# Patient Record
Sex: Female | Born: 1950 | Race: White | Hispanic: No | Marital: Married | Smoking: Never smoker
Health system: Southern US, Community
[De-identification: ages and names within clinical notes are randomized; demographics above are authoritative.]

## PROBLEM LIST (undated history)

## (undated) DIAGNOSIS — F32A Depression, unspecified: Secondary | ICD-10-CM

## (undated) HISTORY — PX: ABDOMINAL HYSTERECTOMY: SHX81

## (undated) HISTORY — PX: SHOULDER SURGERY: SHX246

## (undated) HISTORY — DX: Depression, unspecified: F32.A

---

## 2020-02-25 ENCOUNTER — Emergency Department (HOSPITAL_BASED_OUTPATIENT_CLINIC_OR_DEPARTMENT_OTHER)
Admission: EM | Admit: 2020-02-25 | Discharge: 2020-02-25 | Disposition: A | Discharge status: Home / Self Care | Payer: Medicare Other | Attending: Emergency Medicine | Admitting: Emergency Medicine

## 2020-02-25 ENCOUNTER — Emergency Department (HOSPITAL_BASED_OUTPATIENT_CLINIC_OR_DEPARTMENT_OTHER): Payer: Medicare Other

## 2020-02-25 ENCOUNTER — Other Ambulatory Visit: Payer: Self-pay

## 2020-02-25 ENCOUNTER — Encounter (HOSPITAL_BASED_OUTPATIENT_CLINIC_OR_DEPARTMENT_OTHER): Payer: Self-pay | Admitting: Emergency Medicine

## 2020-02-25 DIAGNOSIS — J208 Acute bronchitis due to other specified organisms: Secondary | ICD-10-CM

## 2020-02-25 DIAGNOSIS — R509 Fever, unspecified: Secondary | ICD-10-CM | POA: Diagnosis not present

## 2020-02-25 DIAGNOSIS — R000 Tachycardia, unspecified: Secondary | ICD-10-CM | POA: Insufficient documentation

## 2020-02-25 DIAGNOSIS — R059 Cough, unspecified: Secondary | ICD-10-CM | POA: Insufficient documentation

## 2020-02-25 DIAGNOSIS — Z20822 Contact with and (suspected) exposure to covid-19: Secondary | ICD-10-CM | POA: Diagnosis not present

## 2020-02-25 LAB — COMPREHENSIVE METABOLIC PANEL
ALT: 52 U/L — ABNORMAL HIGH (ref 0–44)
AST: 46 U/L — ABNORMAL HIGH (ref 15–41)
Albumin: 3.6 g/dL (ref 3.5–5.0)
Alkaline Phosphatase: 61 U/L (ref 38–126)
Anion gap: 10 (ref 5–15)
BUN: 15 mg/dL (ref 8–23)
CO2: 22 mmol/L (ref 22–32)
Calcium: 8.6 mg/dL — ABNORMAL LOW (ref 8.9–10.3)
Chloride: 105 mmol/L (ref 98–111)
Creatinine, Ser: 0.88 mg/dL (ref 0.44–1.00)
GFR, Estimated: >60 mL/min (ref >60)
Glucose, Bld: 132 mg/dL — ABNORMAL HIGH (ref 70–99)
Potassium: 3.8 mmol/L (ref 3.5–5.1)
Sodium: 137 mmol/L (ref 135–145)
Total Bilirubin: 0.5 mg/dL (ref 0.3–1.2)
Total Protein: 6.5 g/dL (ref 6.5–8.1)

## 2020-02-25 LAB — URINALYSIS, ROUTINE W REFLEX MICROSCOPIC
Bilirubin Urine: NEGATIVE
Glucose, UA: NEGATIVE mg/dL
Ketones, ur: NEGATIVE mg/dL
Nitrite: NEGATIVE
Protein, ur: NEGATIVE mg/dL
Specific Gravity, Urine: 1.005 (ref 1.005–1.030)
pH: 6 (ref 5.0–8.0)

## 2020-02-25 LAB — CBC WITH DIFFERENTIAL/PLATELET
Abs Immature Granulocytes: 0.1 10*3/uL — ABNORMAL HIGH (ref 0.00–0.07)
Basophils Absolute: 0 10*3/uL (ref 0.0–0.1)
Basophils Relative: 0 %
Eosinophils Absolute: 0.1 10*3/uL (ref 0.0–0.5)
Eosinophils Relative: 1 %
HCT: 36.8 % (ref 36.0–46.0)
Hemoglobin: 12.6 g/dL (ref 12.0–15.0)
Immature Granulocytes: 1 %
Lymphocytes Relative: 19 %
Lymphs Abs: 2.5 10*3/uL (ref 0.7–4.0)
MCH: 30.5 pg (ref 26.0–34.0)
MCHC: 34.2 g/dL (ref 30.0–36.0)
MCV: 89.1 fL (ref 80.0–100.0)
Monocytes Absolute: 1.1 10*3/uL — ABNORMAL HIGH (ref 0.1–1.0)
Monocytes Relative: 8 %
Neutro Abs: 9.3 10*3/uL — ABNORMAL HIGH (ref 1.7–7.7)
Neutrophils Relative %: 71 %
Platelets: 203 10*3/uL (ref 150–400)
RBC: 4.13 MIL/uL (ref 3.87–5.11)
RDW: 13 % (ref 11.5–15.5)
WBC: 13.1 10*3/uL — ABNORMAL HIGH (ref 4.0–10.5)
nRBC: 0 % (ref 0.0–0.2)

## 2020-02-25 LAB — RESP PANEL BY RT-PCR (FLU A&B, COVID) ARPGX2
Influenza A by PCR: NEGATIVE
Influenza B by PCR: NEGATIVE
SARS Coronavirus 2 by RT PCR: NEGATIVE

## 2020-02-25 LAB — URINALYSIS, MICROSCOPIC (REFLEX)

## 2020-02-25 MED ORDER — SODIUM CHLORIDE 0.9 % IV BOLUS
1000.0000 mL | Freq: Once | INTRAVENOUS | Status: AC
  Administered 2020-02-25: 22:00:00 1000 mL via INTRAVENOUS

## 2020-02-25 MED ORDER — ACETAMINOPHEN 500 MG PO TABS
1000.0000 mg | ORAL_TABLET | Freq: Once | ORAL | Status: AC
  Administered 2020-02-25: 22:00:00 1000 mg via ORAL
  Filled 2020-02-25: qty 2

## 2020-02-25 MED ORDER — AZITHROMYCIN 250 MG PO TABS: ORAL_TABLET | ORAL | 0 refills | Status: AC

## 2020-02-25 NOTE — Discharge Instructions (Signed)
You may have bronchitis  Take Z-Pak as prescribed  Your Covid test is negative right now  See your doctor for follow  Return to ER if you have worse trouble breathing, shortness of breath, fever and vomiting.

## 2020-02-25 NOTE — ED Provider Notes (Signed)
[facility] EMERGENCY DEPARTMENT Provider Note   CSN: [id number] Arrival date & time: 02/25/20  2028     History Chief Complaint  Patient presents with   Fever   Cough    Lauren Gallagher is a 69 y.o. female bipolar, depression, recent Covid infection here presenting with cough and chills and fever.  Patient states that she had Covid on November 12 and received antibody infusion.  She states that she did not receive her maternal booster shot on December 15.  She states that since the booster shot, she has been having persistent body aches.  She is here visiting family and states that she has been having some chills and headache and cough for the last 3 days.  She states that she had low-grade temperature of 100 today.  She has been taking some Mucinex and ibuprofen and cough syrup with minimal relief.  She denies any dysuria.  She states that she just urinates less now.  The history is provided by the patient.       Past Medical History:  Diagnosis Date   Depression     There are no problems to display for this patient.    OB History   No obstetric history on file.     No family history on file.  Social History   Tobacco Use   Smoking status: Never Smoker   Smokeless tobacco: Never Used  Vaping Use   Vaping Use: Never used  Substance Use Topics   Alcohol use: Never   Drug use: Never    Home Medications Prior to Admission medications   Not on File    Allergies    Patient has no allergy information on record.  Review of Systems   Review of Systems  Constitutional: Positive for fever.  Respiratory: Positive for cough.   All other systems reviewed and are negative.   Physical Exam Updated Vital Signs BP 140/74 (BP Location: Right Arm)    Pulse 96    Temp 99 F (37.2 C) (Oral)    Resp 18    Ht 5\' 5"  (1.651 m)    Wt 79.4 kg    SpO2 100%    BMI 29.12 kg/m   Physical Exam Vitals and nursing note reviewed.  Constitutional:       Comments: Uncomfortable, dehydrated, coughing.  HENT:     Head: Normocephalic.     Nose: Nose normal.     Mouth/Throat:     Mouth: Mucous membranes are dry.  Eyes:     Extraocular Movements: Extraocular movements intact.     Pupils: Pupils are equal, round, and reactive to light.  Cardiovascular:     Rate and Rhythm: Regular rhythm. Tachycardia present.     Pulses: Normal pulses.     Heart sounds: Normal heart sounds.  Pulmonary:     Comments: Slightly tachypneic, diminished bilateral bases. Abdominal:     General: Abdomen is flat.     Palpations: Abdomen is soft.  Musculoskeletal:        General: Normal range of motion.     Cervical back: Normal range of motion and neck supple.  Skin:    General: Skin is warm.     Capillary Refill: Capillary refill takes less than 2 seconds.  Neurological:     General: No focal deficit present.     Mental Status: She is oriented to person, place, and time.  Psychiatric:        Mood and Affect: Mood normal.  Behavior: Behavior normal.     ED Results / Procedures / Treatments   Labs (all labs ordered are listed, but only abnormal results are displayed) Labs Reviewed  CBC WITH DIFFERENTIAL/PLATELET - Abnormal; Notable for the following components:      Result Value   WBC 13.1 (*)    Neutro Abs 9.3 (*)    Monocytes Absolute 1.1 (*)    Abs Immature Granulocytes 0.10 (*)    All other components within normal limits  COMPREHENSIVE METABOLIC PANEL - Abnormal; Notable for the following components:   Glucose, Bld 132 (*)    Calcium 8.6 (*)    AST 46 (*)    ALT 52 (*)    All other components within normal limits  RESP PANEL BY RT-PCR (FLU A&B, COVID) [id number]  URINALYSIS, ROUTINE W REFLEX MICROSCOPIC    EKG None  Radiology DG Chest Port 1 View  Result Date: 02/25/2020 CLINICAL DATA:  Fever and cough. EXAM: PORTABLE CHEST 1 VIEW COMPARISON:  None. FINDINGS: The heart size and mediastinal contours are within normal limits. Both  lungs are clear. Grade 3 separation of the left AC joint is seen. IMPRESSION: No active disease. Electronically Signed   By: Aram Candela M.D.   On: 02/25/2020 21:33    Procedures Procedures (including critical care time)  Medications Ordered in ED Medications  sodium chloride 0.9 % bolus 1,000 mL (0 mLs Intravenous Stopped 02/25/20 2245)  acetaminophen (TYLENOL) tablet 1,000 mg (1,000 mg Oral Given 02/25/20 2138)    ED Course  I have reviewed the triage vital signs and the nursing notes.  Pertinent labs & imaging results that were available during my care of the patient were reviewed by me and considered in my medical decision making (see chart for details).    MDM Rules/Calculators/A&P                         Lauren Gallagher is a 69 y.o. female who presented with cough and chills.  Patient did get her Moderna vaccine and got a booster.  Patient has been having headaches and chills and cough.  Consider viral syndrome versus Covid versus pneumonia.  Plan to get CBC, CMP, Covid test, chest x-ray, urinalysis.  Will hydrate and reassess   10:53 PM Labs unremarkable.  Chest x-ray clear. Covid test is negative and urinalysis is normal.  Likely viral bronchitis.  Will discharge home with supportive treatment.  Patient request Z-Pak   Final Clinical Impression(s) / ED Diagnoses Final diagnoses:  None    Rx / DC Orders ED Discharge Orders    None       Charlynne Pander, MD 02/25/20 2254

## 2020-02-25 NOTE — ED Notes (Signed)
States 3 days ago, began having HA, coughing and aching all over, EDP as bedside, rec Booster Vaccine and has not felt well since them. Stats having a fever as well. Has taken mucinex and some ibuprofen, also using cough syrup and cough drops

## 2020-02-25 NOTE — ED Triage Notes (Signed)
Reports having aching, fevers, and cough for the last three days.  Endorses having covid in November.  Received antibody infusion on 01/13/20 and got covid booster on 12/15.  Reports her dr is aware they did it too early.

## 2022-02-10 IMAGING — DX DG CHEST 1V PORT
1 series · 1 of 1 positions shown · non-contrast
Comparison: None.

CLINICAL DATA: Fever and cough.

EXAM:
PORTABLE CHEST 1 VIEW

[chest ap]
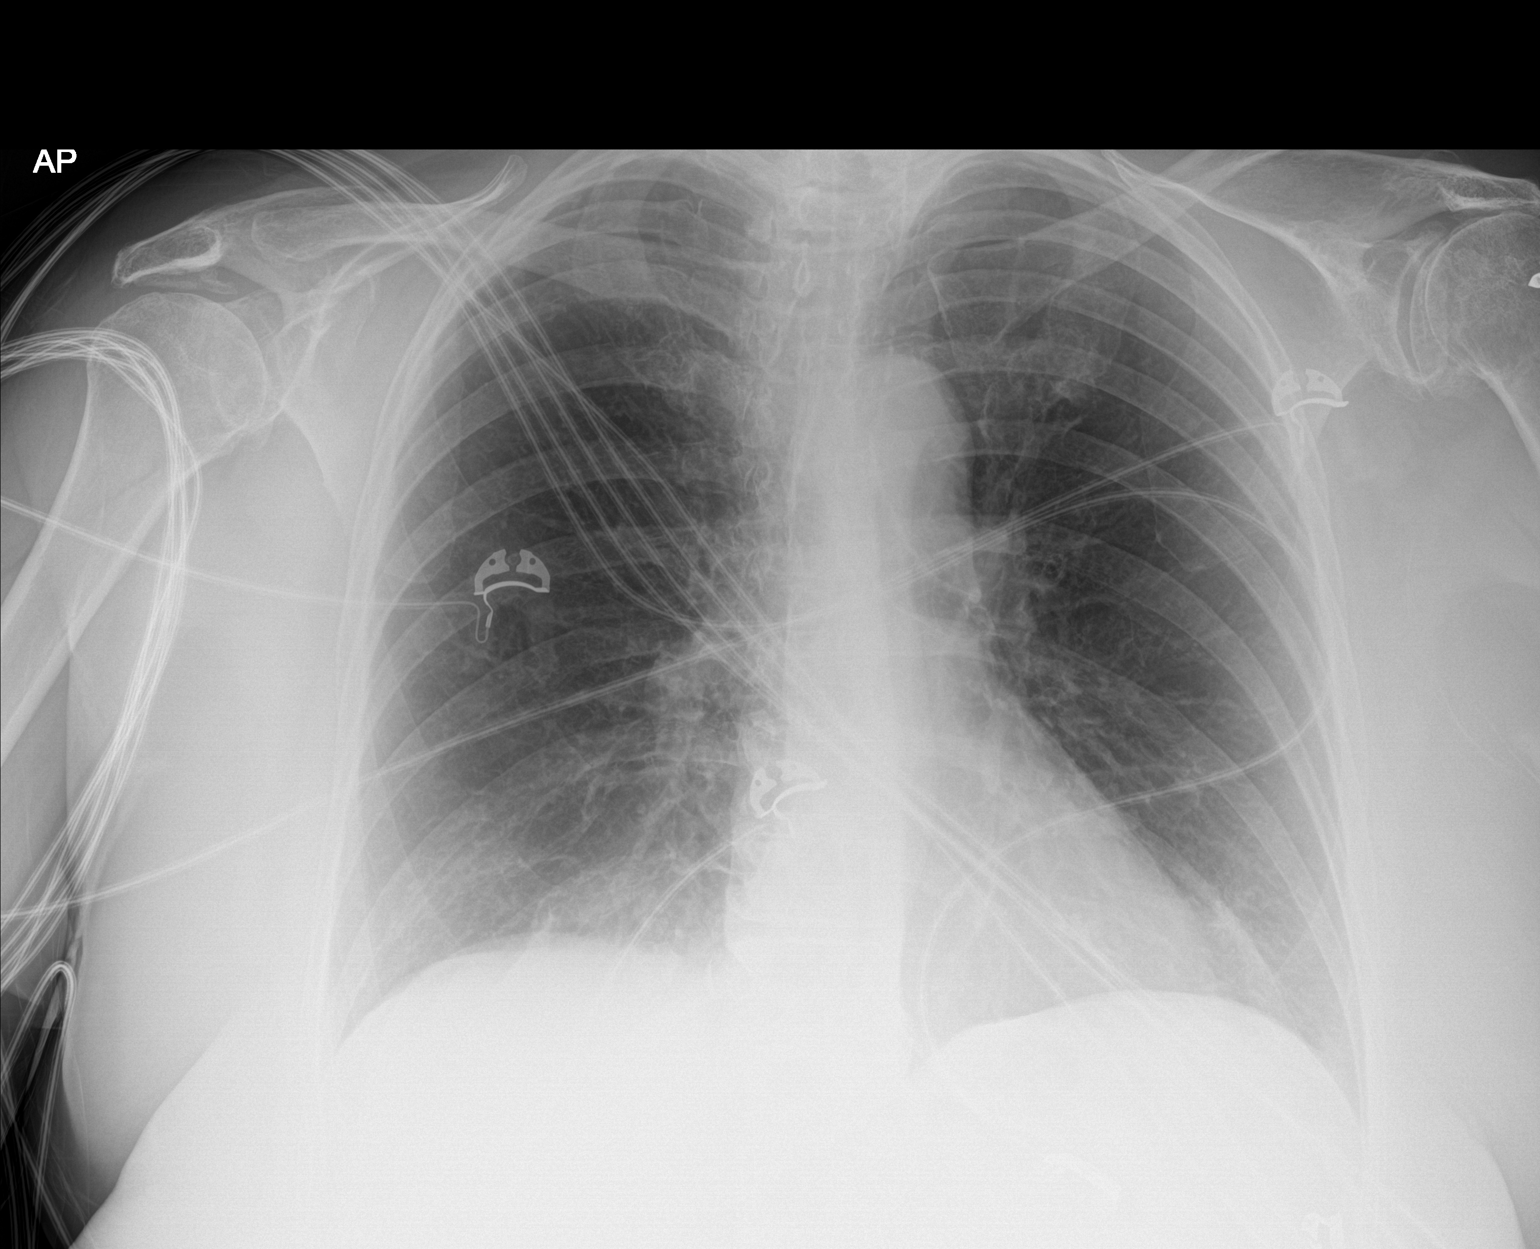

[1 of 1 positions shown; findings below may reference images not displayed]

FINDINGS: The heart size and mediastinal contours are within normal limits.
Both lungs are clear. Grade 3 separation of the left AC joint is
seen.
IMPRESSION: No active disease.
